# Patient Record
Sex: Female | Born: 1995 | Race: Black or African American | Hispanic: No | Marital: Single | Smoking: Never smoker
Health system: Southern US, Community
[De-identification: ages and names within clinical notes are randomized; demographics above are authoritative.]

---

## 2018-12-16 ENCOUNTER — Ambulatory Visit (HOSPITAL_COMMUNITY)
Admission: EM | Admit: 2018-12-16 | Discharge: 2018-12-16 | Disposition: A | Discharge status: Home / Self Care | Payer: Self-pay | Attending: Physician Assistant | Admitting: Physician Assistant

## 2018-12-16 ENCOUNTER — Encounter (HOSPITAL_COMMUNITY): Payer: Self-pay

## 2018-12-16 ENCOUNTER — Other Ambulatory Visit: Payer: Self-pay

## 2018-12-16 DIAGNOSIS — Z202 Contact with and (suspected) exposure to infections with a predominantly sexual mode of transmission: Secondary | ICD-10-CM

## 2018-12-16 DIAGNOSIS — Z3202 Encounter for pregnancy test, result negative: Secondary | ICD-10-CM

## 2018-12-16 LAB — POCT URINALYSIS DIP (DEVICE)
Bilirubin Urine: NEGATIVE
Glucose, UA: NEGATIVE mg/dL
Hgb urine dipstick: NEGATIVE
Ketones, ur: NEGATIVE mg/dL
Leukocytes,Ua: NEGATIVE
Nitrite: NEGATIVE
Protein, ur: NEGATIVE mg/dL
Specific Gravity, Urine: >=1.03 (ref 1.005–1.030)
Urobilinogen, UA: 0.2 mg/dL (ref 0.0–1.0)
pH: 6 (ref 5.0–8.0)

## 2018-12-16 LAB — POCT PREGNANCY, URINE: Preg Test, Ur: NEGATIVE

## 2018-12-16 MED ORDER — AZITHROMYCIN 250 MG PO TABS
ORAL_TABLET | ORAL | Status: AC
  Filled 2018-12-16: qty 4

## 2018-12-16 MED ORDER — AZITHROMYCIN 250 MG PO TABS
1000.0000 mg | ORAL_TABLET | Freq: Once | ORAL | Status: AC
  Administered 2018-12-16: 11:00:00 1000 mg via ORAL

## 2018-12-16 MED ORDER — CEFTRIAXONE SODIUM 250 MG IJ SOLR
250.0000 mg | Freq: Once | INTRAMUSCULAR | Status: AC
  Administered 2018-12-16: 250 mg via INTRAMUSCULAR

## 2018-12-16 MED ORDER — CEFTRIAXONE SODIUM 250 MG IJ SOLR
INTRAMUSCULAR | Status: AC
  Filled 2018-12-16: qty 250

## 2018-12-16 NOTE — Discharge Instructions (Signed)
You were treated empirically for gonorrhea and chlamydia. Azithromycin 1g by mouth and Rocephin 250mg  injection given in office today. Cytology sent, you will be contacted with any positive results that requires further treatment. Refrain from sexual activity and alcohol use for the next 7 days. Monitor for any worsening of symptoms, fever, abdominal pain, nausea, vomiting, to follow up for reevaluation.

## 2018-12-16 NOTE — ED Triage Notes (Signed)
Pt states she has been having pelvis pain for 1 week. Pt thinks she may have an STD.

## 2018-12-16 NOTE — ED Provider Notes (Signed)
[facility]    CSN: [id number] Arrival date & time: 12/16/18  9826     History   Chief Complaint Chief Complaint  Patient presents with  . SEXUALLY TRANSMITTED DISEASE    HPI Nelva Tomich is a 23 y.o. female.   23 year old female comes in for 1 week history of low abdominal pain.  States this is intermittent, feels like cramping.  Denies nausea, vomiting.  Denies fever, chills, night sweats.  Dysuria without hematuria, frequency.  She has has not noticed any vaginal discharge, itching.  LMP 11/24/2018.  Patient states she is sexually active with one female partner, no condom use.  No other birth control use.  Her partners has been having penile discharge, and is being evaluated for GC.      History reviewed. No pertinent past medical history.  There are no active problems to display for this patient.   History reviewed. No pertinent surgical history.  OB History   No obstetric history on file.      Home Medications    Prior to Admission medications   Not on File    Family History Family History  Problem Relation Age of Onset  . Hypertension Father     Social History Social History   Tobacco Use  . Smoking status: Never Smoker  . Smokeless tobacco: Never Used  Substance Use Topics  . Alcohol use: Never    Frequency: Never  . Drug use: Never     Allergies   Patient has no known allergies.   Review of Systems Review of Systems  Reason unable to perform ROS: See HPI as above.     Physical Exam Triage Vital Signs ED Triage Vitals  Enc Vitals Group     BP 12/16/18 0956 120/80     Pulse Rate 12/16/18 0956 76     Resp 12/16/18 0956 16     Temp 12/16/18 0956 99.3 F (37.4 C)     Temp Source 12/16/18 0956 Oral     SpO2 12/16/18 0956 100 %     Weight 12/16/18 0957 184 lb (83.5 kg)     Height --      Head Circumference --      Peak Flow --      Pain Score 12/16/18 0957 5     Pain Loc --      Pain Edu? --      Excl. in [place]? --     No data found.  Updated Vital Signs BP 120/80 (BP Location: Right Arm)   Pulse 76   Temp 99.3 F (37.4 C) (Oral)   Resp 16   Wt 184 lb (83.5 kg)   LMP 11/24/2018   SpO2 100%   Physical Exam Constitutional:      General: She is not in acute distress.    Appearance: She is well-developed. She is not ill-appearing, toxic-appearing or diaphoretic.  HENT:     Head: Normocephalic and atraumatic.  Eyes:     Conjunctiva/sclera: Conjunctivae normal.     Pupils: Pupils are equal, round, and reactive to light.  Cardiovascular:     Rate and Rhythm: Normal rate and regular rhythm.     Heart sounds: Normal heart sounds. No murmur. No friction rub. No gallop.   Pulmonary:     Effort: Pulmonary effort is normal.     Breath sounds: Normal breath sounds. No wheezing or rales.  Abdominal:     General: Bowel sounds are normal.     Palpations: Abdomen  is soft.     Tenderness: There is no abdominal tenderness. There is no right CVA tenderness, left CVA tenderness, guarding or rebound.  Skin:    General: Skin is warm and dry.  Neurological:     Mental Status: She is alert and oriented to person, place, and time.  Psychiatric:        Behavior: Behavior normal.        Judgment: Judgment normal.      UC Treatments / Results  Labs (all labs ordered are listed, but only abnormal results are displayed) Labs Reviewed  POC URINE PREG, ED  POCT URINALYSIS DIP (DEVICE)  POCT PREGNANCY, URINE  CERVICOVAGINAL ANCILLARY ONLY    EKG None  Radiology No results found.  Procedures Procedures (including critical care time)  Medications Ordered in UC Medications  azithromycin (ZITHROMAX) tablet 1,000 mg (1,000 mg Oral Given 12/16/18 1056)  cefTRIAXone (ROCEPHIN) injection 250 mg (250 mg Intramuscular Given 12/16/18 1056)    Initial Impression / Assessment and Plan / UC Course  I have reviewed the triage vital signs and the nursing notes.  Pertinent labs & imaging results that were  available during my care of the patient were reviewed by me and considered in my medical decision making (see chart for details).    Patient was treated empirically for GC. Azithromycin and Rocephin given in office today. Cytology sent, patient will be contacted with any positive results that require additional treatment. Patient to refrain from sexual activity for the next 7 days. Return precautions given.   Final Clinical Impressions(s) / UC Diagnoses   Final diagnoses:  STD exposure    ED Prescriptions    None        Belinda Fisher, PA-C 12/16/18 1107

## 2018-12-17 LAB — CERVICOVAGINAL ANCILLARY ONLY
Chlamydia: NEGATIVE
Neisseria Gonorrhea: NEGATIVE
Trichomonas: NEGATIVE

## 2019-06-30 ENCOUNTER — Other Ambulatory Visit: Payer: Self-pay

## 2019-06-30 ENCOUNTER — Ambulatory Visit (HOSPITAL_COMMUNITY)
Admission: EM | Admit: 2019-06-30 | Discharge: 2019-06-30 | Disposition: A | Discharge status: Home / Self Care | Payer: Medicaid Other | Attending: Internal Medicine | Admitting: Internal Medicine

## 2019-06-30 ENCOUNTER — Encounter (HOSPITAL_COMMUNITY): Payer: Self-pay

## 2019-06-30 DIAGNOSIS — J111 Influenza due to unidentified influenza virus with other respiratory manifestations: Secondary | ICD-10-CM | POA: Insufficient documentation

## 2019-06-30 DIAGNOSIS — Z20828 Contact with and (suspected) exposure to other viral communicable diseases: Secondary | ICD-10-CM | POA: Insufficient documentation

## 2019-06-30 NOTE — ED Provider Notes (Addendum)
[place]    CSN: [id number] Arrival date & time: 06/30/19  1453      History   Chief Complaint Chief Complaint  Patient presents with  . Fever  . Generalized Body Aches    HPI Priscilla Wright is a 23 y.o. female with no past medical history comes to urgent care with a 5-day history of generalized body aches and fevers.  Patient symptoms started insidiously and is been persistent.  She denies any cough or sputum production.  No runny nose, sore throat or shortness of breath.  She denies any sick contacts.  She has had some vomiting and weakness but no diarrhea.  Appetite is preserved.  Patient feels stressed out at this time.  She is currently in between jobs.   HPI  History reviewed. No pertinent past medical history.  There are no active problems to display for this patient.   History reviewed. No pertinent surgical history.  OB History   No obstetric history on file.      Home Medications    Prior to Admission medications   Not on File    Family History Family History  Problem Relation Age of Onset  . Healthy Mother   . Hypertension Father     Social History Social History   Tobacco Use  . Smoking status: Never Smoker  . Smokeless tobacco: Never Used  Substance Use Topics  . Alcohol use: Never    Frequency: Never  . Drug use: Never     Allergies   Patient has no known allergies.   Review of Systems Review of Systems  Constitutional: Positive for activity change, chills, fatigue and fever. Negative for unexpected weight change.  HENT: Negative for congestion, ear pain, rhinorrhea, sinus pain, sneezing and sore throat.   Eyes: Negative.   Respiratory: Negative for chest tightness, shortness of breath and wheezing.   Cardiovascular: Negative.   Gastrointestinal: Negative.   Genitourinary: Negative.   Musculoskeletal: Positive for arthralgias and myalgias.  Skin: Negative.   Neurological: Negative.      Physical Exam  Triage Vital Signs ED Triage Vitals  Enc Vitals Group     BP 06/30/19 1524 129/81     Pulse Rate 06/30/19 1524 95     Resp 06/30/19 1524 16     Temp 06/30/19 1524 99.1 F (37.3 C)     Temp Source 06/30/19 1524 Oral     SpO2 06/30/19 1524 100 %     Weight --      Height --      Head Circumference --      Peak Flow --      Pain Score 06/30/19 1522 5     Pain Loc --      Pain Edu? --      Excl. in [place]? --    No data found.  Updated Vital Signs BP 129/81 (BP Location: Left Arm)   Pulse 95   Temp 99.1 F (37.3 C) (Oral)   Resp 16   SpO2 100%   Visual Acuity Right Eye Distance:   Left Eye Distance:   Bilateral Distance:    Right Eye Near:   Left Eye Near:    Bilateral Near:     Physical Exam Constitutional:      General: She is not in acute distress.    Appearance: She is ill-appearing. She is not toxic-appearing.  HENT:     Mouth/Throat:     Mouth: Mucous membranes are moist.  Pharynx: No oropharyngeal exudate or posterior oropharyngeal erythema.  Eyes:     Conjunctiva/sclera: Conjunctivae normal.  Cardiovascular:     Rate and Rhythm: Normal rate and regular rhythm.     Pulses: Normal pulses.     Heart sounds: No murmur. No friction rub.  Pulmonary:     Effort: Pulmonary effort is normal. No respiratory distress.     Breath sounds: Normal breath sounds. No rhonchi or rales.  Abdominal:     General: Bowel sounds are normal. There is no distension.     Palpations: Abdomen is soft.     Tenderness: There is no abdominal tenderness. There is no rebound.  Musculoskeletal: Normal range of motion.        General: No swelling, tenderness or signs of injury.  Skin:    Capillary Refill: Capillary refill takes less than 2 seconds.  Neurological:     General: No focal deficit present.     Mental Status: She is alert and oriented to person, place, and time.     Cranial Nerves: No cranial nerve deficit.     Sensory: No sensory deficit.      UC Treatments /  Results  Labs (all labs ordered are listed, but only abnormal results are displayed) Labs Reviewed  NOVEL CORONAVIRUS, NAA (HOSP ORDER, SEND-OUT TO REF LAB; TAT 18-24 HRS)    EKG   Radiology No results found.  Procedures Procedures (including critical care time)  Medications Ordered in UC Medications - No data to display  Initial Impression / Assessment and Plan / UC Course  I have reviewed the triage vital signs and the nursing notes.  Pertinent labs & imaging results that were available during my care of the patient were reviewed by me and considered in my medical decision making (see chart for details).     1.  Flulike illness: COVID-19 testing done Patient is advised to self isolate Excuse note given to the patient Patient will be called with the COVID-19 results She is advised to stay hydrated and can use Tylenol/Motrin for generalized body aches Final Clinical Impressions(s) / UC Diagnoses   Final diagnoses:  Influenza-like illness   Discharge Instructions   None    ED Prescriptions    None     PDMP not reviewed this encounter.   Merrilee Jansky, MD 07/01/19 [id number]    Merrilee Jansky, MD 07/02/19 [phone]

## 2019-06-30 NOTE — ED Triage Notes (Signed)
Patient presents to Urgent Care with complaints of generalized body aches and fever since 5 days ago. Patient reports she needs a note to be out of work.

## 2019-07-02 LAB — NOVEL CORONAVIRUS, NAA (HOSP ORDER, SEND-OUT TO REF LAB; TAT 18-24 HRS): SARS-CoV-2, NAA: NOT DETECTED

## 2020-05-15 ENCOUNTER — Emergency Department (HOSPITAL_COMMUNITY): Payer: No Typology Code available for payment source

## 2020-05-15 ENCOUNTER — Emergency Department (HOSPITAL_COMMUNITY)
Admission: EM | Admit: 2020-05-15 | Discharge: 2020-05-15 | Disposition: A | Discharge status: Home / Self Care | Payer: No Typology Code available for payment source | Attending: Emergency Medicine | Admitting: Emergency Medicine

## 2020-05-15 ENCOUNTER — Other Ambulatory Visit: Payer: Self-pay

## 2020-05-15 ENCOUNTER — Encounter (HOSPITAL_COMMUNITY): Payer: Self-pay

## 2020-05-15 DIAGNOSIS — Y929 Unspecified place or not applicable: Secondary | ICD-10-CM | POA: Diagnosis not present

## 2020-05-15 DIAGNOSIS — Y999 Unspecified external cause status: Secondary | ICD-10-CM | POA: Diagnosis not present

## 2020-05-15 DIAGNOSIS — Y939 Activity, unspecified: Secondary | ICD-10-CM | POA: Insufficient documentation

## 2020-05-15 DIAGNOSIS — S060X1A Concussion with loss of consciousness of 30 minutes or less, initial encounter: Secondary | ICD-10-CM

## 2020-05-15 DIAGNOSIS — S8012XA Contusion of left lower leg, initial encounter: Secondary | ICD-10-CM | POA: Diagnosis not present

## 2020-05-15 DIAGNOSIS — S0990XA Unspecified injury of head, initial encounter: Secondary | ICD-10-CM | POA: Diagnosis present

## 2020-05-15 LAB — POC URINE PREG, ED: Preg Test, Ur: NEGATIVE

## 2020-05-15 MED ORDER — IBUPROFEN 600 MG PO TABS: 600.0000 mg | ORAL_TABLET | Freq: Four times a day (QID) | ORAL | 0 refills | Status: AC | PRN

## 2020-05-15 MED ORDER — CYCLOBENZAPRINE HCL 10 MG PO TABS: 10.0000 mg | ORAL_TABLET | Freq: Two times a day (BID) | ORAL | 0 refills | Status: AC | PRN
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
## 2021-05-15 ENCOUNTER — Ambulatory Visit (HOSPITAL_COMMUNITY)
Admission: EM | Admit: 2021-05-15 | Discharge: 2021-05-15 | Disposition: A | Discharge status: Home / Self Care | Payer: Medicaid Other | Attending: Physician Assistant | Admitting: Physician Assistant

## 2021-05-15 ENCOUNTER — Encounter (HOSPITAL_COMMUNITY): Payer: Self-pay | Admitting: *Deleted

## 2021-05-15 DIAGNOSIS — S51812A Laceration without foreign body of left forearm, initial encounter: Secondary | ICD-10-CM

## 2021-05-15 NOTE — ED Triage Notes (Signed)
PT was using a hair blade to cut out hair extensions. Pt cut the Posterior Lt forearm . Site dry on arrival to ED

## 2021-05-15 NOTE — Discharge Instructions (Addendum)
Keep area cleaned with soap and water. Follow up for suture removal in 1 week. Return sooner with any swelling, increased redness, drainage from wound.

## 2021-05-15 NOTE — ED Provider Notes (Signed)
[facility]    CSN: [id number] Arrival date & time: 05/15/21  1329      History   Chief Complaint Chief Complaint  Patient presents with   Laceration    HPI Priscilla Wright is a 25 y.o. female.   Patient here today for evaluation of laceration to her left forearm that occurred earlier this morning.  She reports that she was trying to cut extensions out of her hair and accidentally cut her arm.  She denies continued bleeding.  She has not had fever, but does report some chills but states she is anxious.  No numbness or tingling.  The history is provided by the patient.   History reviewed. No pertinent past medical history.  There are no problems to display for this patient.   History reviewed. No pertinent surgical history.  OB History   No obstetric history on file.      Home Medications    Prior to Admission medications   Medication Sig Start Date End Date Taking? Authorizing Provider  cyclobenzaprine (FLEXERIL) 10 MG tablet Take 1 tablet (10 mg total) by mouth 2 (two) times daily as needed for muscle spasms. 05/15/20   Fayrene Helper, PA-C  ibuprofen (ADVIL) 600 MG tablet Take 1 tablet (600 mg total) by mouth every 6 (six) hours as needed. 05/15/20   Fayrene Helper, PA-C    Family History Family History  Problem Relation Age of Onset   Healthy Mother    Hypertension Father     Social History Social History   Tobacco Use   Smoking status: Never   Smokeless tobacco: Never  Vaping Use   Vaping Use: Never used  Substance Use Topics   Alcohol use: Never   Drug use: Never     Allergies   Patient has no known allergies.   Review of Systems Review of Systems  Constitutional:  Positive for chills. Negative for fever.  Eyes:  Negative for discharge and redness.  Skin:  Positive for wound.  Neurological:  Negative for numbness.    Physical Exam Triage Vital Signs ED Triage Vitals  Enc Vitals Group     BP 05/15/21 1510 (!) 133/92     Pulse  Rate 05/15/21 1510 73     Resp 05/15/21 1510 18     Temp 05/15/21 1510 98.3 F (36.8 C)     Temp src --      SpO2 05/15/21 1510 98 %     Weight --      Height --      Head Circumference --      Peak Flow --      Pain Score 05/15/21 1512 2     Pain Loc --      Pain Edu? --      Excl. in GC? --    No data found.  Updated Vital Signs BP (!) 133/92   Pulse 73   Temp 98.3 F (36.8 C)   Resp 18   LMP 04/30/2021 (Approximate)   SpO2 98%      Physical Exam Vitals and nursing note reviewed.  Constitutional:      General: She is not in acute distress.    Appearance: Normal appearance. She is not ill-appearing.  HENT:     Head: Normocephalic and atraumatic.     Nose: Nose normal.  Cardiovascular:     Rate and Rhythm: Normal rate.  Pulmonary:     Effort: Pulmonary effort is normal.  Skin:    General:  Skin is warm and dry.     Comments: Approximately 4 cm laceration to left forearm with no active bleeding.  Laceration depth to subcu level.  No tendon, musculature appreciated.  Neurological:     Mental Status: She is alert.  Psychiatric:        Mood and Affect: Mood normal.        Behavior: Behavior normal.     UC Treatments / Results  Labs (all labs ordered are listed, but only abnormal results are displayed) Labs Reviewed - No data to display  EKG   Radiology No results found.  Procedures Laceration Repair  Date/Time: 05/15/2021 4:15 PM Performed by: Tomi Bamberger, PA-C Authorized by: Tomi Bamberger, PA-C   Consent:    Consent obtained:  Verbal   Consent given by:  Patient   Risks discussed:  Infection, need for additional repair, pain, poor cosmetic result and poor wound healing   Alternatives discussed:  No treatment and delayed treatment Universal protocol:    Procedure explained and questions answered to patient or proxy's satisfaction: yes     Relevant documents present and verified: yes     Test results available: yes     Imaging studies  available: yes     Required blood products, implants, devices, and special equipment available: yes     Site/side marked: yes     Immediately prior to procedure, a time out was called: yes     Patient identity confirmed:  Verbally with patient Anesthesia:    Anesthesia method:  Local infiltration   Local anesthetic:  Lidocaine 1% WITH epi Laceration details:    Location:  Shoulder/arm   Shoulder/arm location:  L lower arm   Length (cm):  4   Depth (mm):  1 Pre-procedure details:    Preparation:  Patient was prepped and draped in usual sterile fashion Exploration:    Limited defect created (wound extended): no     Wound exploration: entire depth of wound visualized     Contaminated: no   Treatment:    Wound cleansed with: Alcohol.   Amount of cleaning:  Standard   Visualized foreign bodies/material removed: no   Skin repair:    Repair method:  Sutures   Suture size:  4-0   Suture material:  Prolene   Suture technique:  Simple interrupted   Number of sutures:  5 Approximation:    Approximation:  Close Repair type:    Repair type:  Simple Post-procedure details:    Dressing:  Open (no dressing)   Procedure completion:  Tolerated well, no immediate complications (including critical care time)  Medications Ordered in UC Medications - No data to display  Initial Impression / Assessment and Plan / UC Course  I have reviewed the triage vital signs and the nursing notes.  Pertinent labs & imaging results that were available during my care of the patient were reviewed by me and considered in my medical decision making (see chart for details).    Discussed wound care with patient.  Encouraged follow-up in 7days for suture removal.  Encouraged sooner follow-up with any signs or symptoms of infection.  Final Clinical Impressions(s) / UC Diagnoses   Final diagnoses:  Laceration of left forearm, initial encounter     Discharge Instructions      Keep area cleaned with soap  and water. Follow up for suture removal in 1 week. Return sooner with any swelling, increased redness, drainage from wound.      ED Prescriptions  None    PDMP not reviewed this encounter.   Tomi Bamberger, PA-C 05/15/21 [phone]
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
## 2023-05-16 DIAGNOSIS — Z3009 Encounter for other general counseling and advice on contraception: Secondary | ICD-10-CM | POA: Diagnosis not present

## 2023-05-16 DIAGNOSIS — Z3201 Encounter for pregnancy test, result positive: Secondary | ICD-10-CM | POA: Diagnosis not present

## 2023-08-06 DIAGNOSIS — O0930 Supervision of pregnancy with insufficient antenatal care, unspecified trimester: Secondary | ICD-10-CM | POA: Diagnosis not present

## 2023-08-06 DIAGNOSIS — N912 Amenorrhea, unspecified: Secondary | ICD-10-CM | POA: Diagnosis not present

## 2023-08-06 DIAGNOSIS — Z3480 Encounter for supervision of other normal pregnancy, unspecified trimester: Secondary | ICD-10-CM | POA: Diagnosis not present

## 2023-08-06 DIAGNOSIS — Z36 Encounter for antenatal screening for chromosomal anomalies: Secondary | ICD-10-CM | POA: Diagnosis not present

## 2023-08-06 DIAGNOSIS — Z363 Encounter for antenatal screening for malformations: Secondary | ICD-10-CM | POA: Diagnosis not present

## 2023-09-13 DIAGNOSIS — O4440 Low lying placenta NOS or without hemorrhage, unspecified trimester: Secondary | ICD-10-CM | POA: Diagnosis not present

## 2023-09-13 DIAGNOSIS — Z131 Encounter for screening for diabetes mellitus: Secondary | ICD-10-CM | POA: Diagnosis not present

## 2023-10-11 DIAGNOSIS — Z3689 Encounter for other specified antenatal screening: Secondary | ICD-10-CM | POA: Diagnosis not present

## 2023-10-11 DIAGNOSIS — O4440 Low lying placenta NOS or without hemorrhage, unspecified trimester: Secondary | ICD-10-CM | POA: Diagnosis not present
